# Patient Record
Sex: Female | Born: 1995
Health system: Southern US, Community
[De-identification: ages and names within clinical notes are randomized; demographics above are authoritative.]

## PROBLEM LIST (undated history)

## (undated) DIAGNOSIS — J45909 Unspecified asthma, uncomplicated: Secondary | ICD-10-CM

## (undated) DIAGNOSIS — L732 Hidradenitis suppurativa: Secondary | ICD-10-CM

## (undated) HISTORY — DX: Unspecified asthma, uncomplicated: J45.909

---
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
## 2015-07-07 ENCOUNTER — Emergency Department (HOSPITAL_COMMUNITY)
Admission: EM | Admit: 2015-07-07 | Discharge: 2015-07-07 | Disposition: A | Discharge status: Home / Self Care | Payer: No Typology Code available for payment source | Attending: Emergency Medicine | Admitting: Emergency Medicine

## 2015-07-07 ENCOUNTER — Encounter (HOSPITAL_COMMUNITY): Payer: Self-pay

## 2015-07-07 DIAGNOSIS — R112 Nausea with vomiting, unspecified: Secondary | ICD-10-CM | POA: Insufficient documentation

## 2015-07-07 DIAGNOSIS — J45909 Unspecified asthma, uncomplicated: Secondary | ICD-10-CM | POA: Insufficient documentation

## 2015-07-07 DIAGNOSIS — R1013 Epigastric pain: Secondary | ICD-10-CM | POA: Insufficient documentation

## 2015-07-07 DIAGNOSIS — Z79899 Other long term (current) drug therapy: Secondary | ICD-10-CM | POA: Insufficient documentation

## 2015-07-07 DIAGNOSIS — Z792 Long term (current) use of antibiotics: Secondary | ICD-10-CM | POA: Insufficient documentation

## 2015-07-07 DIAGNOSIS — Z791 Long term (current) use of non-steroidal anti-inflammatories (NSAID): Secondary | ICD-10-CM | POA: Insufficient documentation

## 2015-07-07 DIAGNOSIS — J0110 Acute frontal sinusitis, unspecified: Secondary | ICD-10-CM | POA: Insufficient documentation

## 2015-07-07 MED ORDER — AMOXICILLIN-POT CLAVULANATE 875-125 MG PO TABS: 1.0000 | ORAL_TABLET | Freq: Two times a day (BID) | ORAL | Status: DC

## 2015-07-07 MED ORDER — ONDANSETRON 4 MG PO TBDP
4.0000 mg | ORAL_TABLET | Freq: Once | ORAL | Status: AC
  Administered 2015-07-07: 4 mg via ORAL
  Filled 2015-07-07: qty 1

## 2015-07-07 MED ORDER — ONDANSETRON 4 MG PO TBDP: 4.0000 mg | ORAL_TABLET | Freq: Three times a day (TID) | ORAL | Status: DC | PRN

## 2015-07-07 NOTE — ED Notes (Signed)
Onset 2 weeks nasal congestion, green mucus when blowing nose, cough worse at night- interfering with sleep.  Headache across forehead x 3 days, constant and worsening today.  Took Advil this morning.  Pt tearful at triage.

## 2015-07-07 NOTE — ED Provider Notes (Signed)
CSN: [id number]     Arrival date & time 07/07/15  2116 History  By signing my name below, I, Tanda Rockers, attest that this documentation has been prepared under the direction and in the presence of [employer], PA-C.  Electronically Signed: Tanda Rockers, ED Scribe. 07/07/2015. 10:49 PM.   Chief Complaint  Patient presents with  . URI  . Headache   The history is provided by the patient. No language interpreter was used.     HPI Comments: Cynthia Maddox is a 20 y.o. female who presents to the Emergency Department complaining of gradual onset, constant, URI like symptoms including frontal headache and sinus pressure x 10 days. Pt also complains of cough with intermittent productivity, nasal congestion, scratchy throat, vomiting, and nausea. Pt reports 3-4 episodes of vomiting. Pt has been taking Theraflu, OTC nasal spray, Nyquil, Dayquil, and allergy medication without relief. Recent sick contact with coworkers who had the flu. Pt denies risk of pregnancy. Cynthia Maddox is sexually active but is on depovera. Denies fever, loss of appetite, diarrhea, neck stiffness, abnormal vaginal bleeding, vaginal discharge, or any other associated symptoms.     Past Medical History  Diagnosis Date  . Asthma    History reviewed. No pertinent past surgical history. Family History  Problem Relation Age of Onset  . Hyperlipidemia Maternal Grandmother   . Hypertension Maternal Grandmother   . Alcohol abuse Maternal Grandfather    Social History  Substance Use Topics  . Smoking status: Never Smoker   . Smokeless tobacco: None  . Alcohol Use: No   OB History    No data available     Review of Systems  Constitutional: Negative for fever and appetite change.  HENT: Positive for congestion and sinus pressure.   Respiratory: Positive for cough.   Gastrointestinal: Positive for nausea, vomiting and abdominal pain. Negative for diarrhea.  Genitourinary: Negative for vaginal bleeding and vaginal discharge.   Musculoskeletal: Negative for neck stiffness.  Neurological: Positive for headaches.  All other systems reviewed and are negative.  Allergies  Review of patient's allergies indicates no known allergies.  Home Medications   Prior to Admission medications   Medication Sig Start Date End Date Taking? Authorizing Provider  albuterol-ipratropium (COMBIVENT) 18-103 MCG/ACT inhaler Inhale 2 puffs into the lungs every 6 (six) hours as needed for wheezing. 11/15/12   Carmelina Dane, MD  amoxicillin (AMOXIL) 500 MG capsule Take 1 capsule (500 mg total) by mouth 3 (three) times daily. 09/18/12   Reuben Likes, MD  ibuprofen (ADVIL,MOTRIN) 400 MG tablet Take 1 tablet (400 mg total) by mouth every 8 (eight) hours as needed for pain. 09/05/12   Adlih Moreno-Coll, MD  levalbuterol (XOPENEX HFA) 45 MCG/ACT inhaler Inhale 1-2 puffs into the lungs every 4 (four) hours as needed for wheezing. 11/15/12   Carmelina Dane, MD  naproxen (EC NAPROSYN) 500 MG EC tablet Take 500 mg by mouth 2 (two) times daily with a meal.    Historical Provider, MD  naproxen sodium (ANAPROX) 550 MG tablet Take 1 tablet (550 mg total) by mouth 2 (two) times daily with a meal. 11/17/12   Nelva Nay, PA-C  traMADol (ULTRAM) 50 MG tablet Take 1 tablet (50 mg total) by mouth every 6 (six) hours as needed for pain. 09/05/12   Adlih Moreno-Coll, MD   BP 126/86 mmHg  Pulse 105  Temp(Src) 98.3 F (36.8 C) (Oral)  Resp 16  Ht  (1.549 m)  Wt 109 lb 14.4 oz (49.85  kg)  BMI 20.78 kg/m2  SpO2 100%   Physical Exam  Constitutional: Cynthia Maddox is oriented to person, place, and time. Cynthia Maddox appears well-developed and well-nourished. No distress.  HENT:  Head: Normocephalic and atraumatic.  Right Ear: Tympanic membrane and ear canal normal.  Left Ear: Tympanic membrane and ear canal normal.  Nose: Mucosal edema present. Right sinus exhibits frontal sinus tenderness. Left sinus exhibits frontal sinus tenderness.  Mouth/Throat: Oropharynx  is clear and moist.  Eyes: Conjunctivae and EOM are normal.  Neck: Neck supple. No tracheal deviation present.  Cardiovascular: Normal rate, regular rhythm, normal heart sounds and intact distal pulses.   Pulmonary/Chest: Effort normal and breath sounds normal. No respiratory distress. Cynthia Maddox has no wheezes. Cynthia Maddox has no rales.  Abdominal: Soft. Bowel sounds are normal. There is tenderness in the epigastric area.  Remainder of abdomen is non tender  Musculoskeletal: Normal range of motion.  Lymphadenopathy:    Cynthia Maddox has no cervical adenopathy.  Neurological: Cynthia Maddox is alert and oriented to person, place, and time.  Skin: Skin is warm and dry.  Psychiatric: Cynthia Maddox has a normal mood and affect. Her behavior is normal.  Nursing note and vitals reviewed.   ED Course  Procedures (including critical care time)  DIAGNOSTIC STUDIES: Oxygen Saturation is 100% on RA, normal by my interpretation.    COORDINATION OF CARE: 10:48 PM-Discussed treatment plan which includes Rx antibiotics with pt at bedside and pt agreed to plan.   Labs Review Labs Reviewed - No data to display  Imaging Review No results found.    EKG Interpretation None      MDM   Final diagnoses:  None  Patient presents today with symptoms consistent with Acute Sinusitis.  Patient treated with antibiotics and instructed to use decongestant.  Patient also complaining of nausea and vomiting.  Abdomen soft with only mild epigastric tenderness.  Symptoms improved in the ED after given Zofran.  Patient able to tolerate PO liquids prior to discharge.  Stable for discharge.  Given Rx for Zofran.  Return precautions given.    I personally performed the services described in this documentation, which was scribed in my presence. The recorded information has been reviewed and is accurate.      Santiago GladHeather Indyah Saulnier, PA-C 07/08/15 2138  Gerhard Munchobert Lockwood, MD 07/08/15 [phone]

## 2016-10-17 ENCOUNTER — Encounter (HOSPITAL_COMMUNITY): Payer: Self-pay | Admitting: Emergency Medicine

## 2016-10-17 ENCOUNTER — Ambulatory Visit (HOSPITAL_COMMUNITY)
Admission: EM | Admit: 2016-10-17 | Discharge: 2016-10-17 | Disposition: A | Discharge status: Home / Self Care | Payer: Managed Care, Other (non HMO)

## 2016-10-17 DIAGNOSIS — L0291 Cutaneous abscess, unspecified: Secondary | ICD-10-CM

## 2016-10-17 HISTORY — DX: Hidradenitis suppurativa: L73.2

## 2016-10-17 MED ORDER — LIDOCAINE HCL 2 % IJ SOLN
INTRAMUSCULAR | Status: AC
  Filled 2016-10-17: qty 20

## 2016-10-17 MED ORDER — LIDOCAINE HCL (PF) 1 % IJ SOLN: 2.0000 mL | Freq: Once | INTRAMUSCULAR | Status: DC

## 2016-10-17 NOTE — Discharge Instructions (Addendum)
Continue on the abx regimen from your physician Wash and dry the area well  Apply pressure to see if any further drainage comes out of area Follow up with your dermatology in [place]

## 2016-10-17 NOTE — ED Provider Notes (Addendum)
CSN: [id number]     Arrival date & time 10/17/16  1752 History   First MD Initiated Contact with Patient 10/17/16 1929     Chief Complaint  Patient presents with  . Abscess   (Consider location/radiation/quality/duration/timing/severity/associated sxs/prior Treatment) Pt has had multiple vaginal abscess since the age of 21. She sees a dermatology in [place] for this.  She is on batrium for this and is on this daily. She continues to get abscess to vaginal area/ has one that  Has been in the labia area for 3 days. Has used warm compresses with no relief. Painful.       Past Medical History:  Diagnosis Date  . Asthma   . Hidradenitis    History reviewed. No pertinent surgical history. Family History  Problem Relation Age of Onset  . Hyperlipidemia Maternal Grandmother   . Hypertension Maternal Grandmother   . Alcohol abuse Maternal Grandfather    Social History  Substance Use Topics  . Smoking status: Never Smoker  . Smokeless tobacco: Not on file  . Alcohol use No   OB History    No data available     Review of Systems  Constitutional: Negative.   Respiratory: Negative.   Cardiovascular: Negative.   Gastrointestinal: Negative.   Genitourinary: Positive for vaginal pain.       Dime size abscess to rt labia majora   Musculoskeletal: Negative.   Skin: Positive for wound.    Allergies  Patient has no known allergies.  Home Medications   Prior to Admission medications   Medication Sig Start Date End Date Taking? Authorizing Provider  doxycycline (DORYX) 100 MG EC tablet Take 100 mg by mouth 2 (two) times daily.   Yes [provider]  minocycline (DYNACIN) 100 MG tablet Take 100 mg by mouth 2 (two) times daily.   Yes [provider]  medroxyPROGESTERone (DEPO-PROVERA) 150 MG/ML injection Inject 150 mg into the muscle every 3 (three) months.    [provider]   Meds Ordered and Administered this Visit  Medications - No data to  display  BP (!) 98/58 (BP Location: Right Arm)   Pulse 86   Temp 98.3 F (36.8 C) (Oral)   Resp 18   SpO2 100%  No data found.   Physical Exam  Constitutional: She appears well-developed.  Cardiovascular: Normal rate and regular rhythm.   Pulmonary/Chest: Effort normal and breath sounds normal.  Abdominal: Soft. Bowel sounds are normal.  Genitourinary:  Genitourinary Comments: Dime size abscess to rt labia majora   Neurological: She is alert.  Skin: Skin is warm. Capillary refill takes less than 2 seconds. There is erythema.  Dime size abscess to rt labia majora     Urgent Care Course     .Marland Kitchen.Incision and Drainage Date/Time: 10/17/2016 7:57 PM Performed by: Maple MirzaMITCHELL, Jalise Zawistowski A Authorized by: Maple MirzaMITCHELL, Oliviah Agostini A   Consent:    Consent obtained:  Verbal   Consent given by:  Patient and parent   Risks discussed:  Bleeding, incomplete drainage, infection and pain   Alternatives discussed:  Delayed treatment and alternative treatment Location:    Type:  Abscess   Size:  Dime size   Location:  Lower extremity   Lower extremity location: vaginal labia  Pre-procedure details:    Skin preparation:  Betadine Anesthesia (see MAR for exact dosages):    Anesthesia method:  Local infiltration   Local anesthetic:  Lidocaine 2% w/o epi Procedure type:    Complexity:  Simple Procedure details:  Needle aspiration: no     Incision types:  Single straight   Incision depth:  Dermal   Scalpel blade:  10   Wound management:  Irrigated with saline   Drainage:  Serosanguinous   Drainage amount:  Moderate   Wound treatment:  Wound left open   Packing materials:  None Post-procedure details:    Patient tolerance of procedure:  Tolerated well, no immediate complications Comments:     Pt has had multiple abscess in the past    (including critical care time)  Labs Review Labs Reviewed - No data to display  Imaging Review No results found.            MDM   1. Abscess     Continue on the abx regimen from your physician Wash and dry the area well  Apply pressure to see if any further drainage comes out of area Follow up with your dermatology in [place]     Tobi BastosMitchell, Farrie Sann A, NP 10/17/16 1959    Tobi BastosMitchell, Laniece Hornbaker A, NP 10/17/16 2003

## 2016-10-17 NOTE — ED Triage Notes (Signed)
[facility] a [facility] in her groin area.

## 2017-06-06 DIAGNOSIS — L732 Hidradenitis suppurativa: Secondary | ICD-10-CM | POA: Diagnosis not present

## 2017-07-11 DIAGNOSIS — L732 Hidradenitis suppurativa: Secondary | ICD-10-CM | POA: Diagnosis not present

## 2017-07-26 DIAGNOSIS — Z3042 Encounter for surveillance of injectable contraceptive: Secondary | ICD-10-CM | POA: Diagnosis not present

## 2017-08-09 MED FILL — CLINDAMYCIN PH 1% GEL: 1 | 30 days supply | Qty: 60 | Fill #0

## 2017-09-02 ENCOUNTER — Ambulatory Visit: Payer: Self-pay | Admitting: Family Medicine

## 2017-09-02 VITALS — BP 90/60 | HR 94 | Temp 98.5°F | Resp 16 | Ht 61.0 in | Wt 118.8 lb

## 2017-09-02 DIAGNOSIS — Z0000 Encounter for general adult medical examination without abnormal findings: Secondary | ICD-10-CM

## 2017-09-02 NOTE — Patient Instructions (Signed)

## 2017-09-02 NOTE — Progress Notes (Signed)
Cynthia HamperLeah Maddox is a 22 y.o. female who presents today with concerns of need for a physical exam. She denies any current chronic health condition and does confirm a history of seasonal allergies and asthma. She denies any acute health conditions today or any medication use.  Review of Systems  Constitutional: Negative for chills, fever and malaise/fatigue.  HENT: Negative for congestion, ear discharge, ear pain, sinus pain and sore throat.   Eyes: Negative.   Respiratory: Negative for cough, sputum production and shortness of breath.   Cardiovascular: Negative.  Negative for chest pain.  Gastrointestinal: Negative for abdominal pain, diarrhea, nausea and vomiting.  Genitourinary: Negative for dysuria, frequency, hematuria and urgency.  Musculoskeletal: Negative for myalgias.  Skin: Negative.   Neurological: Negative for headaches.  Endo/Heme/Allergies: Negative.   Psychiatric/Behavioral: Negative.     O: Vitals:   09/02/17 1220  BP: 90/60  Pulse: 94  Resp: 16  Temp: 98.5 F (36.9 C)  SpO2: 99%     Physical Exam  Constitutional: She is oriented to person, place, and time. Vital signs are normal. She appears well-developed and well-nourished. She is active.  Non-toxic appearance. She does not have a sickly appearance.  HENT:  Head: Normocephalic.  Right Ear: Hearing, tympanic membrane, external ear and ear canal normal.  Left Ear: Hearing, tympanic membrane, external ear and ear canal normal.  Nose: Nose normal.  Mouth/Throat: Uvula is midline and oropharynx is clear and moist.  Neck: Normal range of motion. Neck supple.  Cardiovascular: Normal rate, regular rhythm, normal heart sounds and normal pulses.  Pulmonary/Chest: Effort normal and breath sounds normal.  Abdominal: Soft. Bowel sounds are normal.  Musculoskeletal: Normal range of motion.  Lymphadenopathy:       Head (right side): No submental and no submandibular adenopathy present.       Head (left side): No submental  and no submandibular adenopathy present.    She has no cervical adenopathy.  Neurological: She is alert and oriented to person, place, and time.  Psychiatric: She has a normal mood and affect.  Vitals reviewed.    A: 1. Physical exam      P: Exam findings, diagnosis etiology and medication use and indications reviewed with patient. Follow- Up and discharge instructions provided. No emergent/urgent issues found on exam.  Patient verbalized understanding of information provided and agrees with plan of care (POC), all questions answered.  1. Physical exam WNL

## 2017-10-12 DIAGNOSIS — M549 Dorsalgia, unspecified: Secondary | ICD-10-CM | POA: Diagnosis not present

## 2017-10-12 DIAGNOSIS — R937 Abnormal findings on diagnostic imaging of other parts of musculoskeletal system: Secondary | ICD-10-CM | POA: Diagnosis not present

## 2017-10-20 DIAGNOSIS — Z3042 Encounter for surveillance of injectable contraceptive: Secondary | ICD-10-CM | POA: Diagnosis not present

## 2017-11-06 DIAGNOSIS — L732 Hidradenitis suppurativa: Secondary | ICD-10-CM | POA: Diagnosis not present

## 2017-11-14 ENCOUNTER — Other Ambulatory Visit: Payer: Self-pay | Admitting: Obstetrics and Gynecology

## 2017-11-14 ENCOUNTER — Other Ambulatory Visit (HOSPITAL_COMMUNITY)
Admission: RE | Admit: 2017-11-14 | Discharge: 2017-11-14 | Disposition: A | Discharge status: Home / Self Care | Payer: 59 | Source: Ambulatory Visit | Attending: Obstetrics and Gynecology | Admitting: Obstetrics and Gynecology

## 2017-11-14 DIAGNOSIS — Z01411 Encounter for gynecological examination (general) (routine) with abnormal findings: Secondary | ICD-10-CM | POA: Diagnosis not present

## 2017-11-14 DIAGNOSIS — Z3042 Encounter for surveillance of injectable contraceptive: Secondary | ICD-10-CM | POA: Diagnosis not present

## 2017-11-14 DIAGNOSIS — Z113 Encounter for screening for infections with a predominantly sexual mode of transmission: Secondary | ICD-10-CM | POA: Diagnosis not present

## 2017-11-14 DIAGNOSIS — Z01419 Encounter for gynecological examination (general) (routine) without abnormal findings: Secondary | ICD-10-CM | POA: Diagnosis not present

## 2017-11-16 LAB — CYTOLOGY - PAP
CHLAMYDIA, DNA PROBE: NEGATIVE
Diagnosis: NEGATIVE
NEISSERIA GONORRHEA: NEGATIVE

## 2017-11-18 DIAGNOSIS — N644 Mastodynia: Secondary | ICD-10-CM | POA: Diagnosis not present

## 2017-11-29 ENCOUNTER — Other Ambulatory Visit: Payer: Self-pay

## 2017-11-29 ENCOUNTER — Inpatient Hospital Stay (HOSPITAL_COMMUNITY)
Admission: AD | Admit: 2017-11-29 | Discharge: 2017-11-29 | Disposition: A | Discharge status: Home / Self Care | Payer: 59 | Source: Ambulatory Visit | Attending: Obstetrics and Gynecology | Admitting: Obstetrics and Gynecology

## 2017-11-29 DIAGNOSIS — Z3A00 Weeks of gestation of pregnancy not specified: Secondary | ICD-10-CM | POA: Insufficient documentation

## 2017-11-29 DIAGNOSIS — O26899 Other specified pregnancy related conditions, unspecified trimester: Secondary | ICD-10-CM | POA: Diagnosis not present

## 2017-11-29 DIAGNOSIS — R1030 Lower abdominal pain, unspecified: Secondary | ICD-10-CM | POA: Diagnosis not present

## 2017-11-29 LAB — URINALYSIS, ROUTINE W REFLEX MICROSCOPIC
Bilirubin Urine: NEGATIVE
Glucose, UA: NEGATIVE mg/dL
Hgb urine dipstick: NEGATIVE
KETONES UR: NEGATIVE mg/dL
LEUKOCYTES UA: NEGATIVE
NITRITE: NEGATIVE
PH: 6 (ref 5.0–8.0)
Protein, ur: NEGATIVE mg/dL
Specific Gravity, Urine: 1.01 (ref 1.005–1.030)

## 2017-11-29 LAB — POCT PREGNANCY, URINE: Preg Test, Ur: NEGATIVE

## 2017-11-29 NOTE — MAU Note (Signed)
Had a pap in Aug. Was having pain in lower abd after and her nipples were really sensitive. That has since resolved.  Friday night she woke in extreme pain and is sweating.  Has happened 2 more times.  Only happens in the middle of the night.right now is having a "small pain", below and to the rt of umbilicus.

## 2017-11-29 NOTE — MAU Note (Signed)
Pt left AMA due to not wanting to wait any longer and pain was not severe.  Provider advised.  Advised her to return if pain was worse.  Patient agreed.

## 2017-11-30 ENCOUNTER — Emergency Department (HOSPITAL_COMMUNITY)
Admission: EM | Admit: 2017-11-30 | Discharge: 2017-12-01 | Disposition: A | Discharge status: Home / Self Care | Payer: 59 | Attending: Emergency Medicine | Admitting: Emergency Medicine

## 2017-11-30 ENCOUNTER — Emergency Department (HOSPITAL_COMMUNITY): Payer: 59

## 2017-11-30 ENCOUNTER — Other Ambulatory Visit: Payer: Self-pay

## 2017-11-30 ENCOUNTER — Encounter (HOSPITAL_COMMUNITY): Payer: Self-pay | Admitting: *Deleted

## 2017-11-30 DIAGNOSIS — N83202 Unspecified ovarian cyst, left side: Secondary | ICD-10-CM | POA: Insufficient documentation

## 2017-11-30 DIAGNOSIS — N8302 Follicular cyst of left ovary: Secondary | ICD-10-CM | POA: Diagnosis not present

## 2017-11-30 DIAGNOSIS — Z79899 Other long term (current) drug therapy: Secondary | ICD-10-CM | POA: Insufficient documentation

## 2017-11-30 DIAGNOSIS — R102 Pelvic and perineal pain: Secondary | ICD-10-CM | POA: Diagnosis present

## 2017-11-30 DIAGNOSIS — J45909 Unspecified asthma, uncomplicated: Secondary | ICD-10-CM | POA: Diagnosis not present

## 2017-11-30 LAB — URINALYSIS, ROUTINE W REFLEX MICROSCOPIC
Bacteria, UA: NONE SEEN
Bilirubin Urine: NEGATIVE
Glucose, UA: NEGATIVE mg/dL
Ketones, ur: NEGATIVE mg/dL
Leukocytes, UA: NEGATIVE
Nitrite: NEGATIVE
PROTEIN: NEGATIVE mg/dL
SPECIFIC GRAVITY, URINE: 1.012 (ref 1.005–1.030)
pH: 8 (ref 5.0–8.0)

## 2017-11-30 LAB — COMPREHENSIVE METABOLIC PANEL
ALK PHOS: 59 U/L (ref 38–126)
ALT: 20 U/L (ref 0–44)
AST: 20 U/L (ref 15–41)
Albumin: 4.7 g/dL (ref 3.5–5.0)
Anion gap: 11 (ref 5–15)
BUN: 11 mg/dL (ref 6–20)
CALCIUM: 9.6 mg/dL (ref 8.9–10.3)
CO2: 22 mmol/L (ref 22–32)
Chloride: 107 mmol/L (ref 98–111)
Creatinine, Ser: 0.62 mg/dL (ref 0.44–1.00)
GFR calc Af Amer: >60 mL/min (ref >60)
GFR calc non Af Amer: >60 mL/min (ref >60)
GLUCOSE: 86 mg/dL (ref 70–99)
Potassium: 3.6 mmol/L (ref 3.5–5.1)
SODIUM: 140 mmol/L (ref 135–145)
Total Bilirubin: 0.7 mg/dL (ref 0.3–1.2)
Total Protein: 8.7 g/dL — ABNORMAL HIGH (ref 6.5–8.1)

## 2017-11-30 LAB — CBC
HCT: 39.7 % (ref 36.0–46.0)
HEMOGLOBIN: 12.7 g/dL (ref 12.0–15.0)
MCH: 26.7 pg (ref 26.0–34.0)
MCHC: 32 g/dL (ref 30.0–36.0)
MCV: 83.4 fL (ref 78.0–100.0)
Platelets: 275 10*3/uL (ref 150–400)
RBC: 4.76 MIL/uL (ref 3.87–5.11)
RDW: 14.1 % (ref 11.5–15.5)
WBC: 5.9 10*3/uL (ref 4.0–10.5)

## 2017-11-30 LAB — I-STAT BETA HCG BLOOD, ED (MC, WL, AP ONLY): I-stat hCG, quantitative: <5 m[IU]/mL (ref <5)

## 2017-11-30 LAB — LIPASE, BLOOD: Lipase: 36 U/L (ref 11–51)

## 2017-11-30 NOTE — ED Triage Notes (Signed)
Pt reports having a pap smear on 8/27th and since then she has had severe lower abd and pelvic pain which only occurs at night which happened three times now.  Her pap smear result is unremarkable.  She's contacted her PCP and was advised to come to the ED to get evaluated for possible ovarian torsion.  Denies vag bleeding or vomiting but reports nausea.

## 2017-11-30 NOTE — ED Notes (Signed)
Pt reports no urinary issues, no vomiting.

## 2017-11-30 NOTE — ED Provider Notes (Signed)
[facility]-EMERGENCY DEPT Provider Note   CSN: [id number] Arrival date & time: 11/30/17  1723     History   Chief Complaint Chief Complaint  Patient presents with  . Abdominal Pain    HPI Cynthia Maddox is a 22 y.o. female.  Patient presents to the emergency department with a chief complaint of lower abdominal pain.  She reports that the symptoms started about 2 weeks ago.  She states that she has had 3-4 episodes of very severe pain.  She states that she called her doctor, and was advised to come to the emergency department to have an ultrasound to rule out ovarian torsion.  Patient reports only mild pain now.  She rates her pain is a 2 out of 10.  She states that she has had some subjective fevers, but has not measured her temperature.  She reports associated nausea, but denies any vomiting or diarrhea.  She denies any vaginal bleeding or discharge.  Denies any dysuria, hematuria, frequency, or urgency.  The history is provided by the patient. No language interpreter was used.    Past Medical History:  Diagnosis Date  . Asthma   . Hidradenitis     There are no active problems to display for this patient.   History reviewed. No pertinent surgical history.   OB History   None      Home Medications    Prior to Admission medications   Medication Sig Start Date End Date Taking? Authorizing Provider  BIOTIN PO Take 1 tablet by mouth daily.   Yes [provider]  doxycycline (DORYX) 100 MG EC tablet Take 100 mg by mouth daily.    Yes [provider]  medroxyPROGESTERone (DEPO-PROVERA) 150 MG/ML injection Inject 150 mg into the muscle every 3 (three) months.   Yes [provider]  sulfamethoxazole-trimethoprim (BACTRIM DS,SEPTRA DS) 800-160 MG tablet Take 1 tablet by mouth daily.   Yes [provider]    Family History Family History  Problem Relation Age of Onset  . Hyperlipidemia Maternal Grandmother   .  Hypertension Maternal Grandmother   . Alcohol abuse Maternal Grandfather     Social History Social History   Tobacco Use  . Smoking status: Never Smoker  . Smokeless tobacco: Never Used  Substance Use Topics  . Alcohol use: Yes    Comment: occa  . Drug use: No     Allergies   Patient has no known allergies.   Review of Systems Review of Systems  All other systems reviewed and are negative.    Physical Exam Updated Vital Signs BP (!) 145/86   Pulse (!) 106   Temp 99.6 F (37.6 C) (Oral)   Resp 16   Ht 5\' 2"  (1.575 m)   Wt 55.4 kg   SpO2 100%   BMI 22.33 kg/m   Physical Exam  Constitutional: She is oriented to person, place, and time. She appears well-developed and well-nourished.  HENT:  Head: Normocephalic and atraumatic.  Eyes: Pupils are equal, round, and reactive to light. Conjunctivae and EOM are normal.  Neck: Normal range of motion. Neck supple.  Cardiovascular: Normal rate and regular rhythm. Exam reveals no gallop and no friction rub.  No murmur heard. Pulmonary/Chest: Effort normal and breath sounds normal. No respiratory distress. She has no wheezes. She has no rales. She exhibits no tenderness.  Abdominal: Soft. Bowel sounds are normal. She exhibits no distension and no mass. There is tenderness in the suprapubic area. There is no  rebound and no guarding.  Musculoskeletal: Normal range of motion. She exhibits no edema or tenderness.  Neurological: She is alert and oriented to person, place, and time.  Skin: Skin is warm and dry.  Psychiatric: She has a normal mood and affect. Her behavior is normal. Judgment and thought content normal.  Nursing note and vitals reviewed.    ED Treatments / Results  Labs (all labs ordered are listed, but only abnormal results are displayed) Labs Reviewed  COMPREHENSIVE METABOLIC PANEL - Abnormal; Notable for the following components:      Result Value   Total Protein 8.7 (*)    All other components within  normal limits  URINALYSIS, ROUTINE W REFLEX MICROSCOPIC - Abnormal; Notable for the following components:   Hgb urine dipstick SMALL (*)    All other components within normal limits  WET PREP, GENITAL  LIPASE, BLOOD  CBC  I-STAT BETA HCG BLOOD, ED ([facility], [facility], [facility] ONLY)  GC/CHLAMYDIA PROBE AMP ([facility]) NOT AT [facility]    EKG None  Radiology US Transvaginal Non-ob  Result Date: 11/30/2017 CLINICAL DATA:  Initial evaluation for acute left adnexal pain. EXAM: TRANSABDOMINAL AND TRANSVAGINAL ULTRASOUND OF PELVIS DOPPLER ULTRASOUND OF OVARIES TECHNIQUE: Both transabdominal and transvaginal ultrasound examinations of the pelvis were performed. Transabdominal technique was performed for global imaging of the pelvis including uterus, ovaries, adnexal regions, and pelvic cul-de-sac. It was necessary to proceed with endovaginal exam following the transabdominal exam to visualize the uterus, endometrium, and ovaries. Color and duplex Doppler ultrasound was utilized to evaluate blood flow to the ovaries. COMPARISON:  None. FINDINGS: Uterus Measurements: 5.3 x 2.5 x 3.8 cm. No fibroids or other mass visualized. Endometrium Thickness: 2.7 mm.  No focal abnormality visualized. Right ovary Not visualized.  No adnexal mass. Left ovary Measurements: 2.8 x 1.1 x 2.8 cm. Normal appearance/no adnexal mass. 1.9 x 1.1 x 1.5 cm simple physiologic follicular cyst noted. Pulsed Doppler evaluation of the left ovary demonstrates normal low-resistance arterial and venous waveforms. Other findings No abnormal free fluid. IMPRESSION: 1. 1.9 cm left ovarian simple physiologic follicular cyst. 2. Otherwise unremarkable and normal appearance of the left ovary with no evidence for torsion. 3. Nonvisualization of the right ovary due to overlying bowel gas. No adnexal mass. 4. Normal sonographic appearance of the uterus and endometrium. Electronically Signed   By: Rise Mu M.D.   On: 11/30/2017 23:32   US Pelvis  Complete  Result Date: 11/30/2017 CLINICAL DATA:  Initial evaluation for acute left adnexal pain. EXAM: TRANSABDOMINAL AND TRANSVAGINAL ULTRASOUND OF PELVIS DOPPLER ULTRASOUND OF OVARIES TECHNIQUE: Both transabdominal and transvaginal ultrasound examinations of the pelvis were performed. Transabdominal technique was performed for global imaging of the pelvis including uterus, ovaries, adnexal regions, and pelvic cul-de-sac. It was necessary to proceed with endovaginal exam following the transabdominal exam to visualize the uterus, endometrium, and ovaries. Color and duplex Doppler ultrasound was utilized to evaluate blood flow to the ovaries. COMPARISON:  None. FINDINGS: Uterus Measurements: 5.3 x 2.5 x 3.8 cm. No fibroids or other mass visualized. Endometrium Thickness: 2.7 mm.  No focal abnormality visualized. Right ovary Not visualized.  No adnexal mass. Left ovary Measurements: 2.8 x 1.1 x 2.8 cm. Normal appearance/no adnexal mass. 1.9 x 1.1 x 1.5 cm simple physiologic follicular cyst noted. Pulsed Doppler evaluation of the left ovary demonstrates normal low-resistance arterial and venous waveforms. Other findings No abnormal free fluid. IMPRESSION: 1. 1.9 cm left ovarian simple physiologic follicular cyst. 2. Otherwise unremarkable and normal appearance  of the left ovary with no evidence for torsion. 3. Nonvisualization of the right ovary due to overlying bowel gas. No adnexal mass. 4. Normal sonographic appearance of the uterus and endometrium. Electronically Signed   By: Rise MuBenjamin  McClintock M.D.   On: 11/30/2017 23:32   [place] Art/ven Flow Abd Pelv Doppler  Result Date: 11/30/2017 CLINICAL DATA:  Initial evaluation for acute left adnexal pain. EXAM: TRANSABDOMINAL AND TRANSVAGINAL ULTRASOUND OF PELVIS DOPPLER ULTRASOUND OF OVARIES TECHNIQUE: Both transabdominal and transvaginal ultrasound examinations of the pelvis were performed. Transabdominal technique was performed for global imaging of the pelvis  including uterus, ovaries, adnexal regions, and pelvic cul-de-sac. It was necessary to proceed with endovaginal exam following the transabdominal exam to visualize the uterus, endometrium, and ovaries. Color and duplex Doppler ultrasound was utilized to evaluate blood flow to the ovaries. COMPARISON:  None. FINDINGS: Uterus Measurements: 5.3 x 2.5 x 3.8 cm. No fibroids or other mass visualized. Endometrium Thickness: 2.7 mm.  No focal abnormality visualized. Right ovary Not visualized.  No adnexal mass. Left ovary Measurements: 2.8 x 1.1 x 2.8 cm. Normal appearance/no adnexal mass. 1.9 x 1.1 x 1.5 cm simple physiologic follicular cyst noted. Pulsed Doppler evaluation of the left ovary demonstrates normal low-resistance arterial and venous waveforms. Other findings No abnormal free fluid. IMPRESSION: 1. 1.9 cm left ovarian simple physiologic follicular cyst. 2. Otherwise unremarkable and normal appearance of the left ovary with no evidence for torsion. 3. Nonvisualization of the right ovary due to overlying bowel gas. No adnexal mass. 4. Normal sonographic appearance of the uterus and endometrium. Electronically Signed   By: Rise MuBenjamin  McClintock M.D.   On: 11/30/2017 23:32    Procedures Procedures (including critical care time)  Medications Ordered in ED Medications - No data to display   Initial Impression / Assessment and Plan / ED Course  I have reviewed the triage vital signs and the nursing notes.  Pertinent labs & imaging results that were available during my care of the patient were reviewed by me and considered in my medical decision making (see chart for details).     Patient with ongoing suprapubic and left adnexal pain times several weeks.  She has some severe episodes of pain.  Ultrasound shows 1.9 cm left ovarian cyst.  No evidence of torsion.  Patient is well-appearing.  She is in no acute distress.  Patient denies any vaginal discharge or bleeding.  She does not want to have pelvic  exam performed.  Recommend OB/GYN follow-up.  Will treat with NSAIDs for ovarian cyst.  Final Clinical Impressions(s) / ED Diagnoses   Final diagnoses:  Cyst of left ovary    ED Discharge Orders         Ordered    ibuprofen (ADVIL,MOTRIN) 800 MG tablet  3 times daily     12/01/17 0009           Roxy HorsemanBrowning, Fred Hammes, PA-C 12/01/17 0010    Mancel BaleWentz, Elliott, MD 12/01/17 1924

## 2017-12-01 MED ORDER — IBUPROFEN 800 MG PO TABS: 800.0000 mg | ORAL_TABLET | Freq: Three times a day (TID) | ORAL | 0 refills | Status: AC

## 2017-12-13 DIAGNOSIS — N83202 Unspecified ovarian cyst, left side: Secondary | ICD-10-CM | POA: Diagnosis not present

## 2017-12-13 DIAGNOSIS — R102 Pelvic and perineal pain: Secondary | ICD-10-CM | POA: Diagnosis not present

## 2017-12-29 DIAGNOSIS — G8918 Other acute postprocedural pain: Secondary | ICD-10-CM | POA: Diagnosis not present

## 2017-12-29 DIAGNOSIS — L732 Hidradenitis suppurativa: Secondary | ICD-10-CM | POA: Diagnosis not present

## 2017-12-29 MED FILL — HYDROCODON-APAP 5-325: 5-325 | 3 days supply | Qty: 10 | Fill #0

## 2018-01-11 DIAGNOSIS — Z3042 Encounter for surveillance of injectable contraceptive: Secondary | ICD-10-CM | POA: Diagnosis not present

## 2018-05-16 DIAGNOSIS — Z30011 Encounter for initial prescription of contraceptive pills: Secondary | ICD-10-CM | POA: Diagnosis not present

## 2018-05-16 MED FILL — DROSPIR-ETH ESTRA 3/.02 MG: 3-0.02 | 84 days supply | Qty: 84 | Fill #0 | Status: TO

## 2018-07-11 DIAGNOSIS — R102 Pelvic and perineal pain: Secondary | ICD-10-CM | POA: Diagnosis not present

## 2018-07-11 DIAGNOSIS — N946 Dysmenorrhea, unspecified: Secondary | ICD-10-CM | POA: Diagnosis not present

## 2018-07-28 MED FILL — DROSPIR-ETH ESTRA 3/.02 MG: 3-0.02 | 84 days supply | Qty: 84 | Fill #0

## 2018-08-20 DIAGNOSIS — Z0000 Encounter for general adult medical examination without abnormal findings: Secondary | ICD-10-CM | POA: Diagnosis not present

## 2018-08-20 DIAGNOSIS — R632 Polyphagia: Secondary | ICD-10-CM | POA: Diagnosis not present

## 2018-08-27 DIAGNOSIS — Z0000 Encounter for general adult medical examination without abnormal findings: Secondary | ICD-10-CM | POA: Diagnosis not present

## 2018-08-27 DIAGNOSIS — Z13220 Encounter for screening for lipoid disorders: Secondary | ICD-10-CM | POA: Diagnosis not present

## 2018-08-27 DIAGNOSIS — R632 Polyphagia: Secondary | ICD-10-CM | POA: Diagnosis not present

## 2018-09-25 IMAGING — US US PELVIS COMPLETE
1 series · 13 of 25 positions shown · non-contrast
Comparison: None.

CLINICAL DATA: Initial evaluation for acute left adnexal pain.

EXAM:
TRANSABDOMINAL AND TRANSVAGINAL ULTRASOUND OF PELVIS
DOPPLER ULTRASOUND OF OVARIES
TECHNIQUE: Both transabdominal and transvaginal ultrasound examinations of the
pelvis were performed. Transabdominal technique was performed for
global imaging of the pelvis including uterus, ovaries, adnexal
regions, and pelvic cul-de-sac.
It was necessary to proceed with endovaginal exam following the
transabdominal exam to visualize the uterus, endometrium, and
ovaries. Color and duplex Doppler ultrasound was utilized to
evaluate blood flow to the ovaries.

[Series 1: us pelvis complete · 13 of 81 slices shown]
[im 1/81]
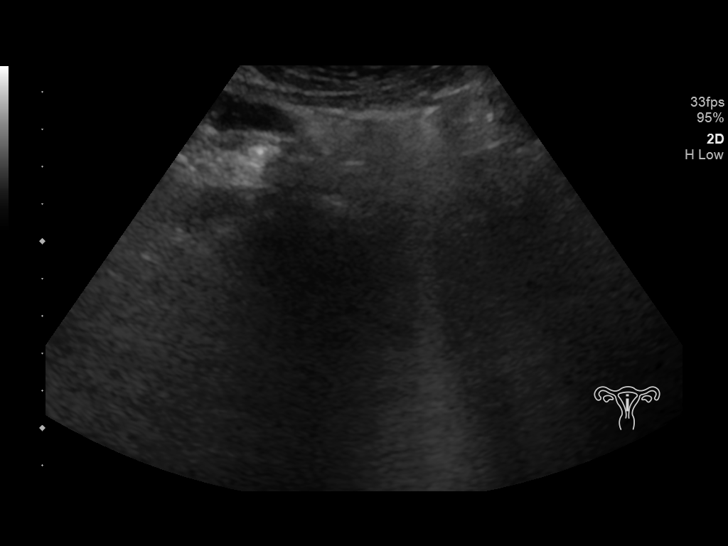
[im 7/81]
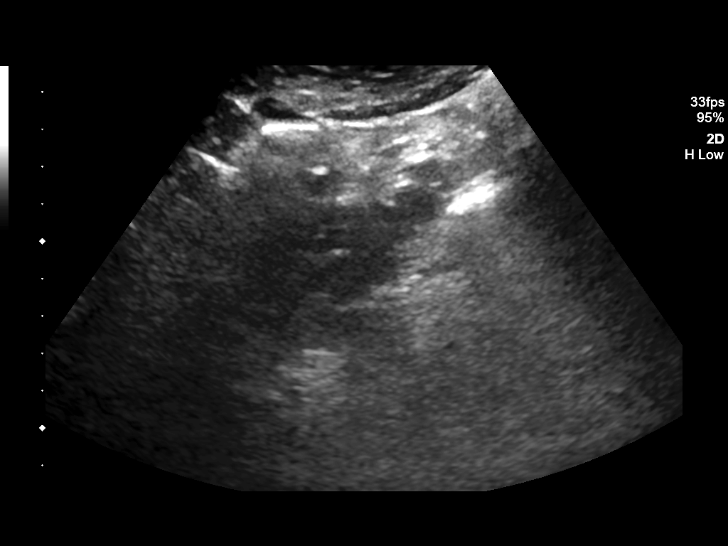
[im 14/81]
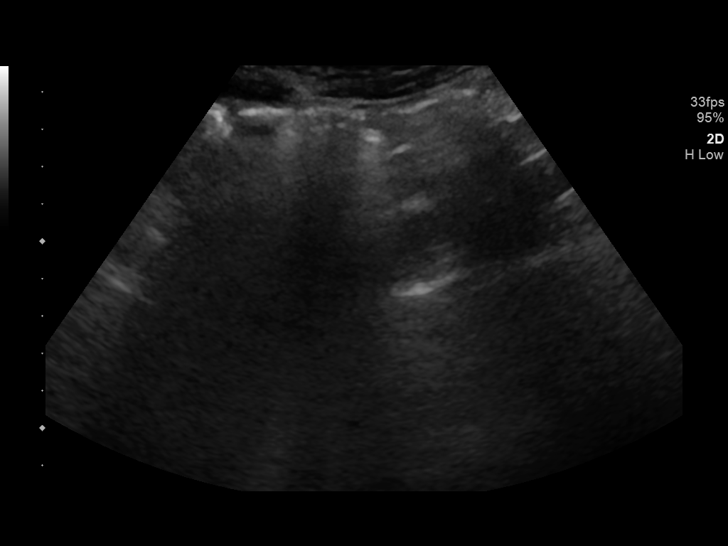
[im 21/81]
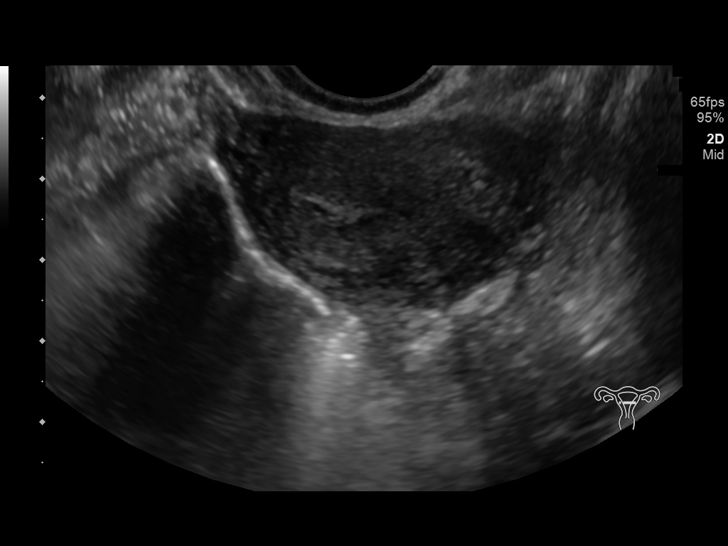
[im 27/81]
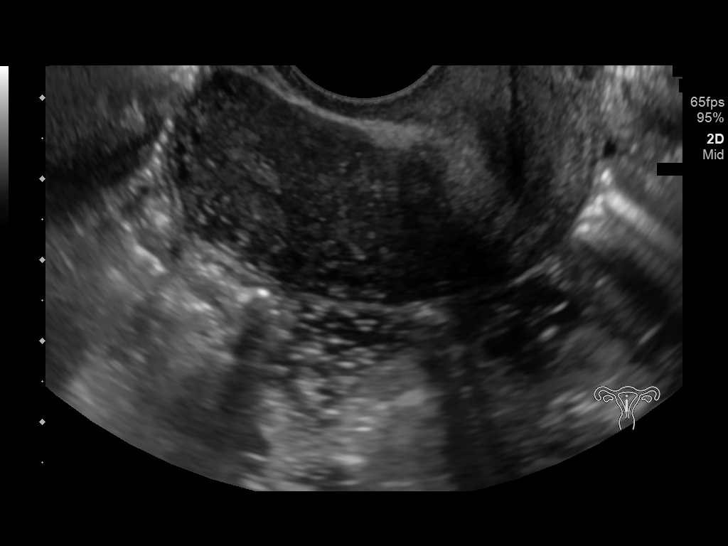
[im 34/81]
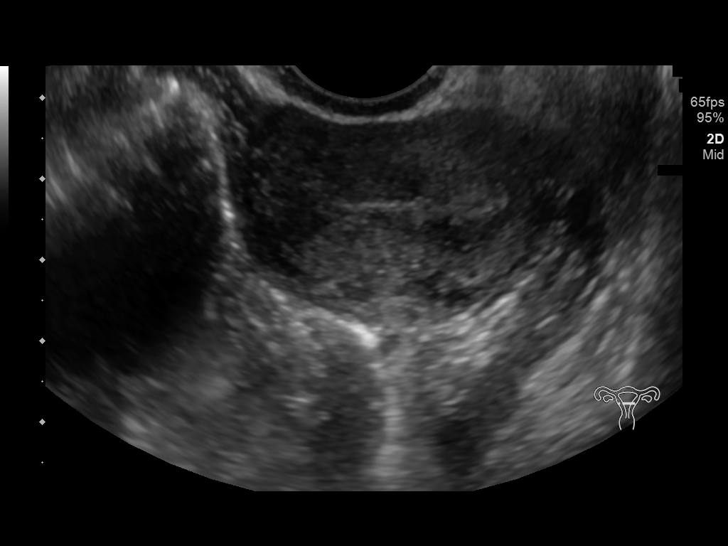
[im 41/81]
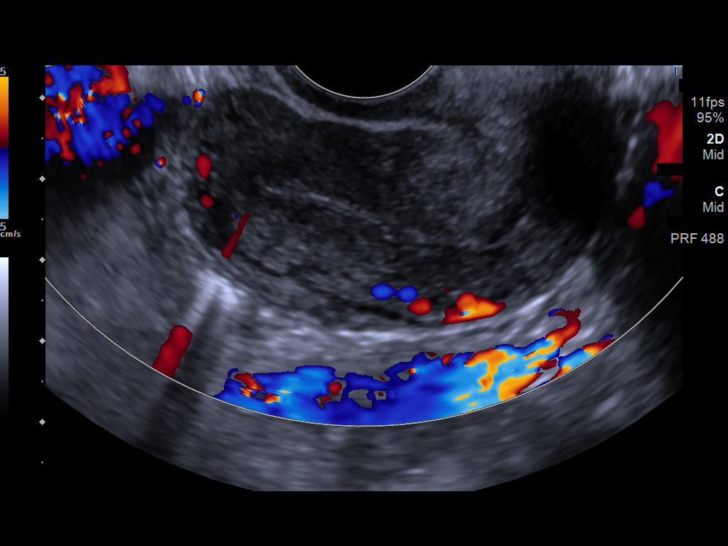
[im 47/81]
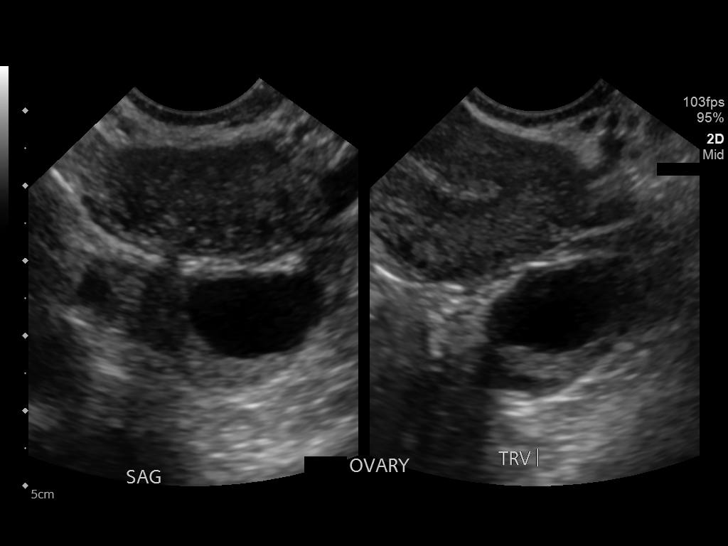
[im 54/81]
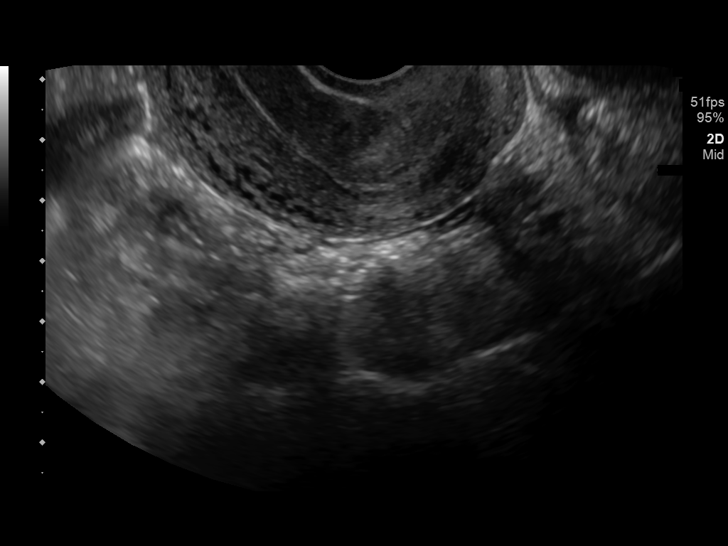
[im 61/81]
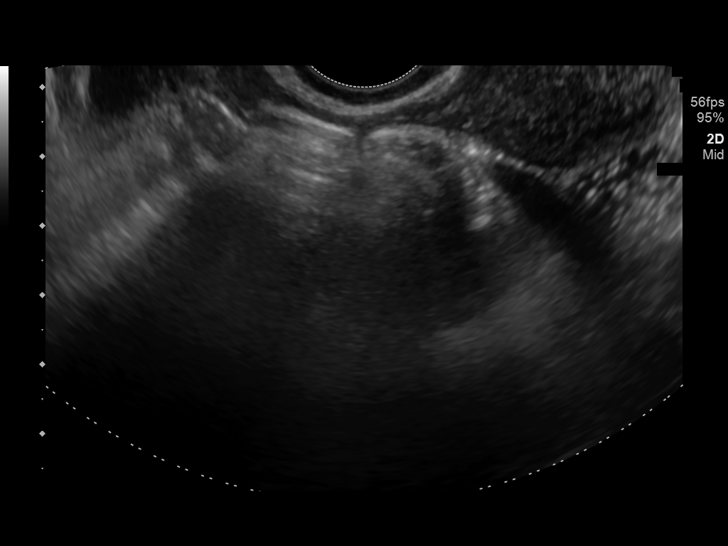
[im 67/81]
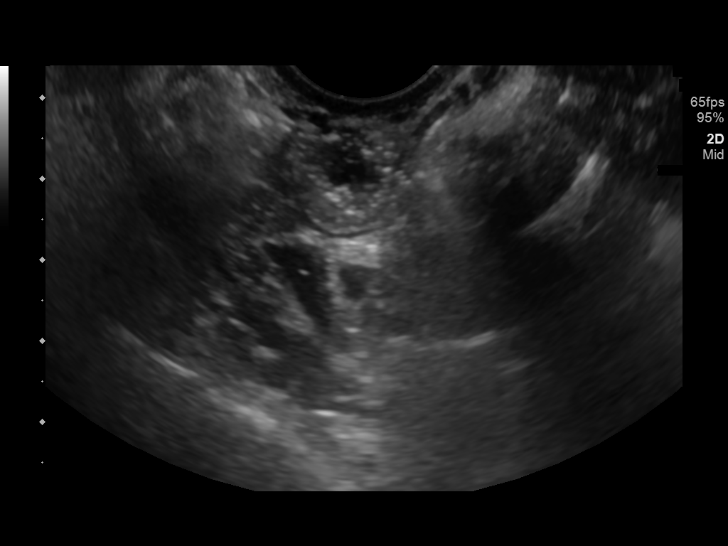
[im 74/81]
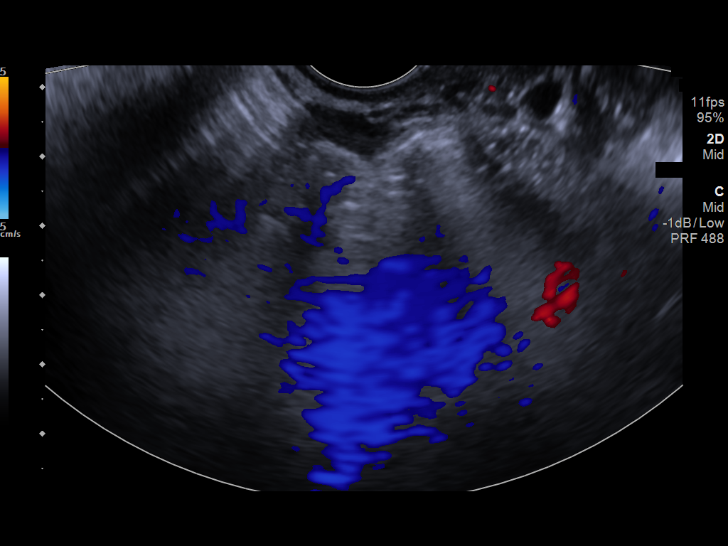
[im 81/81]
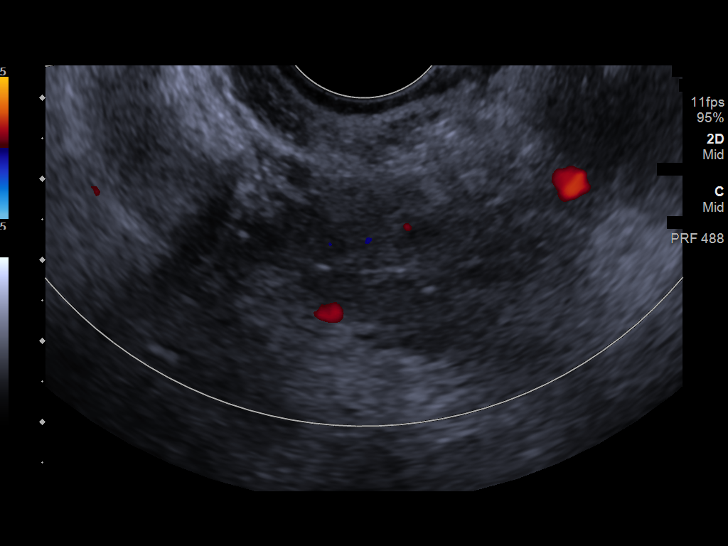

[13 of 25 positions shown; findings below may reference images not displayed]

FINDINGS: Uterus

Measurements: 5.3 x 2.5 x 3.8 cm. No fibroids or other mass
visualized.

Endometrium

Thickness: 2.7 mm.  No focal abnormality visualized.

Right ovary

Not visualized.  No adnexal mass.

Left ovary

Measurements: 2.8 x 1.1 x 2.8 cm. Normal appearance/no adnexal mass.
1.9 x 1.1 x 1.5 cm simple physiologic follicular cyst noted.

Pulsed Doppler evaluation of the left ovary demonstrates normal
low-resistance arterial and venous waveforms.

Other findings

No abnormal free fluid.
IMPRESSION: 1. 1.9 cm left ovarian simple physiologic follicular cyst.
2. Otherwise unremarkable and normal appearance of the left ovary
with no evidence for torsion.
3. Nonvisualization of the right ovary due to overlying bowel gas.
No adnexal mass.
4. Normal sonographic appearance of the uterus and endometrium.

## 2018-09-26 DIAGNOSIS — H5213 Myopia, bilateral: Secondary | ICD-10-CM | POA: Diagnosis not present

## 2018-10-09 MED FILL — DROSPIR-ETH ESTRA 3/.02 MG: 3-0.02 | 84 days supply | Qty: 84 | Fill #0

## 2018-10-29 DIAGNOSIS — K219 Gastro-esophageal reflux disease without esophagitis: Secondary | ICD-10-CM | POA: Diagnosis not present

## 2018-10-29 DIAGNOSIS — R198 Other specified symptoms and signs involving the digestive system and abdomen: Secondary | ICD-10-CM | POA: Diagnosis not present

## 2018-10-29 MED FILL — ESOMEPRAZOLE MAG DR 40 MG C: 40 | 30 days supply | Qty: 30 | Fill #0

## 2018-11-02 DIAGNOSIS — K219 Gastro-esophageal reflux disease without esophagitis: Secondary | ICD-10-CM | POA: Diagnosis not present

## 2018-11-19 ENCOUNTER — Other Ambulatory Visit (HOSPITAL_COMMUNITY)
Admission: RE | Admit: 2018-11-19 | Discharge: 2018-11-19 | Disposition: A | Discharge status: Home / Self Care | Payer: 59 | Source: Ambulatory Visit | Attending: Obstetrics and Gynecology | Admitting: Obstetrics and Gynecology

## 2018-11-19 ENCOUNTER — Other Ambulatory Visit: Payer: Self-pay | Admitting: Obstetrics and Gynecology

## 2018-11-19 DIAGNOSIS — Z01419 Encounter for gynecological examination (general) (routine) without abnormal findings: Secondary | ICD-10-CM | POA: Diagnosis not present

## 2018-11-19 DIAGNOSIS — Z309 Encounter for contraceptive management, unspecified: Secondary | ICD-10-CM | POA: Diagnosis not present

## 2018-11-21 LAB — CYTOLOGY - PAP
Chlamydia: NEGATIVE
Diagnosis: NEGATIVE
Neisseria Gonorrhea: NEGATIVE

## 2019-01-04 MED FILL — DROSPIR-ETH ESTRA 3/.02 MG: 3-0.02 | 84 days supply | Qty: 84 | Fill #0

## 2019-03-01 DIAGNOSIS — H5213 Myopia, bilateral: Secondary | ICD-10-CM | POA: Diagnosis not present

## 2019-03-01 DIAGNOSIS — H18713 Corneal ectasia, bilateral: Secondary | ICD-10-CM | POA: Diagnosis not present

## 2019-04-22 DIAGNOSIS — L989 Disorder of the skin and subcutaneous tissue, unspecified: Secondary | ICD-10-CM | POA: Diagnosis not present

## 2019-04-22 DIAGNOSIS — M7989 Other specified soft tissue disorders: Secondary | ICD-10-CM | POA: Diagnosis not present

## 2019-04-22 MED FILL — MUPIROCIN 2% OINTMENT: 2 | 7 days supply | Qty: 22 | Fill #0

## 2019-07-12 DIAGNOSIS — L732 Hidradenitis suppurativa: Secondary | ICD-10-CM | POA: Diagnosis not present

## 2019-07-15 ENCOUNTER — Other Ambulatory Visit (HOSPITAL_COMMUNITY): Payer: Self-pay | Admitting: Dermatology

## 2019-07-15 MED FILL — MINOCYCLINE 100 MG CAPSULE: 100 | 30 days supply | Qty: 60 | Fill #0

## 2019-09-25 DIAGNOSIS — Z3169 Encounter for other general counseling and advice on procreation: Secondary | ICD-10-CM | POA: Diagnosis not present

## 2019-09-25 DIAGNOSIS — J4540 Moderate persistent asthma, uncomplicated: Secondary | ICD-10-CM | POA: Diagnosis not present

## 2019-09-25 DIAGNOSIS — Z0000 Encounter for general adult medical examination without abnormal findings: Secondary | ICD-10-CM | POA: Diagnosis not present

## 2019-09-25 MED FILL — ALBUTEROL SULFATE HFA 108 (: 108 (90 BAS | 30 days supply | Qty: 9 | Fill #0

## 2019-09-25 MED FILL — SYMBICORT 80-4.5 MCG INH: 80-4.5 | 30 days supply | Qty: 10 | Fill #0

## 2019-09-26 MED FILL — AEROCHAMBER: 30 days supply | Qty: 1 | Fill #0

## 2019-11-08 DIAGNOSIS — Z23 Encounter for immunization: Secondary | ICD-10-CM | POA: Diagnosis not present

## 2019-11-20 DIAGNOSIS — H5213 Myopia, bilateral: Secondary | ICD-10-CM | POA: Diagnosis not present

## 2019-11-20 DIAGNOSIS — H18713 Corneal ectasia, bilateral: Secondary | ICD-10-CM | POA: Diagnosis not present

## 2019-12-02 DIAGNOSIS — Z01419 Encounter for gynecological examination (general) (routine) without abnormal findings: Secondary | ICD-10-CM | POA: Diagnosis not present

## 2020-01-06 DIAGNOSIS — L732 Hidradenitis suppurativa: Secondary | ICD-10-CM | POA: Diagnosis not present

## 2020-01-06 DIAGNOSIS — L910 Hypertrophic scar: Secondary | ICD-10-CM | POA: Diagnosis not present

## 2020-02-19 MED FILL — MINOCYCLINE 100 MG CAPSULE: 100 | 30 days supply | Qty: 60 | Fill #1

## 2020-04-07 ENCOUNTER — Other Ambulatory Visit (HOSPITAL_COMMUNITY): Payer: Self-pay

## 2020-04-07 DIAGNOSIS — L732 Hidradenitis suppurativa: Secondary | ICD-10-CM | POA: Diagnosis not present

## 2020-04-07 MED FILL — traMADol HCL 50 MG TABS: 50 | 3 days supply | Qty: 12 | Fill #0

## 2020-05-22 DIAGNOSIS — F330 Major depressive disorder, recurrent, mild: Secondary | ICD-10-CM | POA: Diagnosis not present

## 2020-05-29 DIAGNOSIS — F330 Major depressive disorder, recurrent, mild: Secondary | ICD-10-CM | POA: Diagnosis not present

## 2020-06-05 DIAGNOSIS — F330 Major depressive disorder, recurrent, mild: Secondary | ICD-10-CM | POA: Diagnosis not present

## 2020-06-19 DIAGNOSIS — F330 Major depressive disorder, recurrent, mild: Secondary | ICD-10-CM | POA: Diagnosis not present

## 2020-07-03 DIAGNOSIS — F330 Major depressive disorder, recurrent, mild: Secondary | ICD-10-CM | POA: Diagnosis not present

## 2020-09-18 DIAGNOSIS — F330 Major depressive disorder, recurrent, mild: Secondary | ICD-10-CM | POA: Diagnosis not present

## 2020-09-25 DIAGNOSIS — F330 Major depressive disorder, recurrent, mild: Secondary | ICD-10-CM | POA: Diagnosis not present

## 2020-09-30 DIAGNOSIS — L732 Hidradenitis suppurativa: Secondary | ICD-10-CM | POA: Diagnosis not present

## 2020-10-02 DIAGNOSIS — Z0000 Encounter for general adult medical examination without abnormal findings: Secondary | ICD-10-CM | POA: Diagnosis not present

## 2020-10-02 DIAGNOSIS — F330 Major depressive disorder, recurrent, mild: Secondary | ICD-10-CM | POA: Diagnosis not present

## 2020-10-05 DIAGNOSIS — B379 Candidiasis, unspecified: Secondary | ICD-10-CM | POA: Diagnosis not present

## 2020-10-08 ENCOUNTER — Other Ambulatory Visit (HOSPITAL_COMMUNITY): Payer: Self-pay

## 2020-10-08 MED ORDER — FLUCONAZOLE 150 MG PO TABS
150.0000 mg | ORAL_TABLET | Freq: Every day | ORAL | 0 refills | Status: AC
  Filled 2020-10-08: qty 1, 1d supply, fill #0

## 2020-10-09 DIAGNOSIS — F330 Major depressive disorder, recurrent, mild: Secondary | ICD-10-CM | POA: Diagnosis not present

## 2020-10-16 DIAGNOSIS — F330 Major depressive disorder, recurrent, mild: Secondary | ICD-10-CM | POA: Diagnosis not present

## 2020-10-20 DIAGNOSIS — F330 Major depressive disorder, recurrent, mild: Secondary | ICD-10-CM | POA: Diagnosis not present

## 2020-10-23 DIAGNOSIS — F330 Major depressive disorder, recurrent, mild: Secondary | ICD-10-CM | POA: Diagnosis not present

## 2020-10-30 DIAGNOSIS — F330 Major depressive disorder, recurrent, mild: Secondary | ICD-10-CM | POA: Diagnosis not present

## 2020-11-06 DIAGNOSIS — F330 Major depressive disorder, recurrent, mild: Secondary | ICD-10-CM | POA: Diagnosis not present

## 2020-11-13 DIAGNOSIS — F330 Major depressive disorder, recurrent, mild: Secondary | ICD-10-CM | POA: Diagnosis not present

## 2020-11-20 DIAGNOSIS — F330 Major depressive disorder, recurrent, mild: Secondary | ICD-10-CM | POA: Diagnosis not present

## 2020-12-03 DIAGNOSIS — Z113 Encounter for screening for infections with a predominantly sexual mode of transmission: Secondary | ICD-10-CM | POA: Diagnosis not present

## 2020-12-03 DIAGNOSIS — Z01419 Encounter for gynecological examination (general) (routine) without abnormal findings: Secondary | ICD-10-CM | POA: Diagnosis not present

## 2020-12-04 DIAGNOSIS — F330 Major depressive disorder, recurrent, mild: Secondary | ICD-10-CM | POA: Diagnosis not present

## 2020-12-16 DIAGNOSIS — H5213 Myopia, bilateral: Secondary | ICD-10-CM | POA: Diagnosis not present

## 2020-12-16 DIAGNOSIS — H52221 Regular astigmatism, right eye: Secondary | ICD-10-CM | POA: Diagnosis not present

## 2020-12-18 DIAGNOSIS — F330 Major depressive disorder, recurrent, mild: Secondary | ICD-10-CM | POA: Diagnosis not present

## 2021-01-01 DIAGNOSIS — F330 Major depressive disorder, recurrent, mild: Secondary | ICD-10-CM | POA: Diagnosis not present

## 2021-01-07 ENCOUNTER — Other Ambulatory Visit (HOSPITAL_COMMUNITY): Payer: Self-pay

## 2021-01-07 DIAGNOSIS — L700 Acne vulgaris: Secondary | ICD-10-CM | POA: Diagnosis not present

## 2021-01-07 MED ORDER — TRETINOIN 0.05 % EX CREA
TOPICAL_CREAM | CUTANEOUS | 2 refills | Status: AC
  Filled 2021-01-07: qty 45, 30d supply, fill #0

## 2021-01-07 MED ORDER — DOXYCYCLINE HYCLATE 100 MG PO CAPS
100.0000 mg | ORAL_CAPSULE | Freq: Two times a day (BID) | ORAL | 2 refills | Status: AC
  Filled 2021-01-07: qty 60, 30d supply, fill #0
  Filled 2021-02-08: qty 60, 30d supply, fill #1
  Filled 2021-03-25: qty 60, 30d supply, fill #2

## 2021-01-11 DIAGNOSIS — Z23 Encounter for immunization: Secondary | ICD-10-CM | POA: Diagnosis not present

## 2021-01-13 DIAGNOSIS — L910 Hypertrophic scar: Secondary | ICD-10-CM | POA: Diagnosis not present

## 2021-01-13 DIAGNOSIS — L732 Hidradenitis suppurativa: Secondary | ICD-10-CM | POA: Diagnosis not present

## 2021-01-22 DIAGNOSIS — F330 Major depressive disorder, recurrent, mild: Secondary | ICD-10-CM | POA: Diagnosis not present

## 2021-02-05 DIAGNOSIS — F330 Major depressive disorder, recurrent, mild: Secondary | ICD-10-CM | POA: Diagnosis not present

## 2021-02-08 ENCOUNTER — Other Ambulatory Visit (HOSPITAL_COMMUNITY): Payer: Self-pay

## 2021-02-19 DIAGNOSIS — F330 Major depressive disorder, recurrent, mild: Secondary | ICD-10-CM | POA: Diagnosis not present

## 2021-03-04 DIAGNOSIS — F330 Major depressive disorder, recurrent, mild: Secondary | ICD-10-CM | POA: Diagnosis not present

## 2021-03-22 ENCOUNTER — Other Ambulatory Visit (HOSPITAL_COMMUNITY): Payer: Self-pay

## 2021-03-23 ENCOUNTER — Other Ambulatory Visit (HOSPITAL_COMMUNITY): Payer: Self-pay

## 2021-03-23 MED ORDER — PHENAZOPYRIDINE HCL 200 MG PO TABS
200.0000 mg | ORAL_TABLET | Freq: Three times a day (TID) | ORAL | 0 refills | Status: AC
  Filled 2021-03-23: qty 6, 2d supply, fill #0

## 2021-03-23 MED ORDER — CIPROFLOXACIN HCL 500 MG PO TABS
500.0000 mg | ORAL_TABLET | Freq: Two times a day (BID) | ORAL | 0 refills | Status: AC
  Filled 2021-03-23: qty 10, 5d supply, fill #0

## 2021-03-25 ENCOUNTER — Other Ambulatory Visit (HOSPITAL_COMMUNITY): Payer: Self-pay

## 2021-04-08 ENCOUNTER — Other Ambulatory Visit (HOSPITAL_COMMUNITY): Payer: Self-pay

## 2021-04-08 MED ORDER — FLUCONAZOLE 150 MG PO TABS
150.0000 mg | ORAL_TABLET | ORAL | 0 refills | Status: AC
  Filled 2021-04-08: qty 2, 3d supply, fill #0

## 2021-12-06 ENCOUNTER — Other Ambulatory Visit: Payer: Self-pay | Admitting: Obstetrics and Gynecology

## 2021-12-06 ENCOUNTER — Other Ambulatory Visit (HOSPITAL_COMMUNITY)
Admission: RE | Admit: 2021-12-06 | Discharge: 2021-12-06 | Disposition: A | Discharge status: Home / Self Care | Payer: Managed Care, Other (non HMO) | Source: Ambulatory Visit | Attending: Obstetrics and Gynecology | Admitting: Obstetrics and Gynecology

## 2021-12-06 DIAGNOSIS — Z01419 Encounter for gynecological examination (general) (routine) without abnormal findings: Secondary | ICD-10-CM | POA: Diagnosis not present

## 2021-12-08 LAB — CYTOLOGY - PAP: Diagnosis: NEGATIVE

## 2023-05-04 ENCOUNTER — Encounter (HOSPITAL_BASED_OUTPATIENT_CLINIC_OR_DEPARTMENT_OTHER): Payer: Self-pay | Admitting: Emergency Medicine

## 2023-05-04 ENCOUNTER — Other Ambulatory Visit: Payer: Self-pay

## 2023-05-04 ENCOUNTER — Emergency Department (HOSPITAL_BASED_OUTPATIENT_CLINIC_OR_DEPARTMENT_OTHER)
Admission: EM | Admit: 2023-05-04 | Discharge: 2023-05-05 | Disposition: A | Discharge status: Home / Self Care | Payer: Managed Care, Other (non HMO) | Attending: Emergency Medicine | Admitting: Emergency Medicine

## 2023-05-04 DIAGNOSIS — R519 Headache, unspecified: Secondary | ICD-10-CM | POA: Insufficient documentation

## 2023-05-04 DIAGNOSIS — R112 Nausea with vomiting, unspecified: Secondary | ICD-10-CM | POA: Diagnosis not present

## 2023-05-04 DIAGNOSIS — J45909 Unspecified asthma, uncomplicated: Secondary | ICD-10-CM | POA: Diagnosis not present

## 2023-05-04 DIAGNOSIS — D649 Anemia, unspecified: Secondary | ICD-10-CM | POA: Diagnosis not present

## 2023-05-04 DIAGNOSIS — R42 Dizziness and giddiness: Secondary | ICD-10-CM | POA: Insufficient documentation

## 2023-05-04 DIAGNOSIS — T5991XA Toxic effect of unspecified gases, fumes and vapors, accidental (unintentional), initial encounter: Secondary | ICD-10-CM

## 2023-05-04 LAB — COMPREHENSIVE METABOLIC PANEL
ALT: 18 U/L (ref 0–44)
AST: 21 U/L (ref 15–41)
Albumin: 4.2 g/dL (ref 3.5–5.0)
Alkaline Phosphatase: 33 U/L — ABNORMAL LOW (ref 38–126)
Anion gap: 11 (ref 5–15)
BUN: 15 mg/dL (ref 6–20)
CO2: 20 mmol/L — ABNORMAL LOW (ref 22–32)
Calcium: 8.8 mg/dL — ABNORMAL LOW (ref 8.9–10.3)
Chloride: 106 mmol/L (ref 98–111)
Creatinine, Ser: 0.7 mg/dL (ref 0.44–1.00)
GFR, Estimated: >60 mL/min (ref >60)
Glucose, Bld: 85 mg/dL (ref 70–99)
Potassium: 3.5 mmol/L (ref 3.5–5.1)
Sodium: 137 mmol/L (ref 135–145)
Total Bilirubin: 0.8 mg/dL (ref 0.0–1.2)
Total Protein: 8.2 g/dL — ABNORMAL HIGH (ref 6.5–8.1)

## 2023-05-04 LAB — URINALYSIS, ROUTINE W REFLEX MICROSCOPIC
Bilirubin Urine: NEGATIVE
Glucose, UA: NEGATIVE mg/dL
Hgb urine dipstick: NEGATIVE
Ketones, ur: 80 mg/dL — AB
Leukocytes,Ua: NEGATIVE
Nitrite: NEGATIVE
Protein, ur: NEGATIVE mg/dL
Specific Gravity, Urine: 1.02 (ref 1.005–1.030)
pH: 6 (ref 5.0–8.0)

## 2023-05-04 LAB — LIPASE, BLOOD: Lipase: 28 U/L (ref 11–51)

## 2023-05-04 LAB — CBC
HCT: 36.7 % (ref 36.0–46.0)
Hemoglobin: 11.8 g/dL — ABNORMAL LOW (ref 12.0–15.0)
MCH: 26.5 pg (ref 26.0–34.0)
MCHC: 32.2 g/dL (ref 30.0–36.0)
MCV: 82.5 fL (ref 80.0–100.0)
Platelets: 272 10*3/uL (ref 150–400)
RBC: 4.45 MIL/uL (ref 3.87–5.11)
RDW: 13.9 % (ref 11.5–15.5)
WBC: 7.5 10*3/uL (ref 4.0–10.5)
nRBC: 0 % (ref 0.0–0.2)

## 2023-05-04 LAB — PREGNANCY, URINE: Preg Test, Ur: NEGATIVE

## 2023-05-04 MED ORDER — PROCHLORPERAZINE EDISYLATE 10 MG/2ML IJ SOLN
10.0000 mg | Freq: Once | INTRAMUSCULAR | Status: AC
  Administered 2023-05-04: 10 mg via INTRAVENOUS
  Filled 2023-05-04: qty 2

## 2023-05-04 MED ORDER — KETOROLAC TROMETHAMINE 15 MG/ML IJ SOLN
15.0000 mg | Freq: Once | INTRAMUSCULAR | Status: AC
  Administered 2023-05-04: 15 mg via INTRAVENOUS
  Filled 2023-05-04: qty 1

## 2023-05-04 MED ORDER — MAGNESIUM SULFATE 2 GM/50ML IV SOLN
2.0000 g | Freq: Once | INTRAVENOUS | Status: AC
  Administered 2023-05-04: 2 g via INTRAVENOUS
  Filled 2023-05-04: qty 50

## 2023-05-04 MED ORDER — DIPHENHYDRAMINE HCL 50 MG/ML IJ SOLN
25.0000 mg | Freq: Once | INTRAMUSCULAR | Status: AC
  Administered 2023-05-04: 25 mg via INTRAVENOUS
  Filled 2023-05-04: qty 1

## 2023-05-04 MED ORDER — SODIUM CHLORIDE 0.9 % IV BOLUS
1000.0000 mL | Freq: Once | INTRAVENOUS | Status: AC
  Administered 2023-05-05: 1000 mL via INTRAVENOUS

## 2023-05-04 MED ORDER — ONDANSETRON 4 MG PO TBDP
4.0000 mg | ORAL_TABLET | Freq: Once | ORAL | Status: AC | PRN
  Administered 2023-05-04: 4 mg via ORAL
  Filled 2023-05-04: qty 1

## 2023-05-04 MED ORDER — SODIUM CHLORIDE 0.9 % IV BOLUS
1000.0000 mL | Freq: Once | INTRAVENOUS | Status: AC
  Administered 2023-05-04: 1000 mL via INTRAVENOUS
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
## 2023-05-05 MED ORDER — ONDANSETRON 4 MG PO TBDP
4.0000 mg | ORAL_TABLET | Freq: Three times a day (TID) | ORAL | 0 refills | Status: AC | PRN
Start: 2023-05-05 — End: 2023-05-08
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
